# Patient Record
Sex: Male | Born: 1965 | Hispanic: No | Marital: Married | Smoking: Never smoker
Health system: Southern US, Community
[De-identification: ages and names within clinical notes are randomized; demographics above are authoritative.]

## PROBLEM LIST (undated history)

## (undated) DIAGNOSIS — E038 Other specified hypothyroidism: Principal | ICD-10-CM

## (undated) DIAGNOSIS — L309 Dermatitis, unspecified: Secondary | ICD-10-CM

## (undated) DIAGNOSIS — E782 Mixed hyperlipidemia: Secondary | ICD-10-CM

## (undated) DIAGNOSIS — S46912A Strain of unspecified muscle, fascia and tendon at shoulder and upper arm level, left arm, initial encounter: Secondary | ICD-10-CM

## (undated) DIAGNOSIS — B019 Varicella without complication: Secondary | ICD-10-CM

## (undated) HISTORY — DX: Varicella without complication: B01.9

## (undated) HISTORY — DX: Dermatitis, unspecified: L30.9

## (undated) HISTORY — DX: Mixed hyperlipidemia: E78.2

## (undated) HISTORY — DX: Strain of unspecified muscle, fascia and tendon at shoulder and upper arm level, left arm, initial encounter: S46.912A

## (undated) HISTORY — DX: Other specified hypothyroidism: E03.8

---

## 1993-11-20 HISTORY — PX: APPENDECTOMY: SHX54

## 2008-08-07 ENCOUNTER — Emergency Department (HOSPITAL_COMMUNITY): Admission: EM | Admit: 2008-08-07 | Discharge: 2008-08-07 | Payer: Self-pay | Admitting: Emergency Medicine
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
## 2010-05-18 ENCOUNTER — Ambulatory Visit: Payer: Self-pay | Admitting: Family Medicine

## 2010-05-18 DIAGNOSIS — Z9189 Other specified personal risk factors, not elsewhere classified: Secondary | ICD-10-CM | POA: Insufficient documentation

## 2010-05-18 DIAGNOSIS — E663 Overweight: Secondary | ICD-10-CM | POA: Insufficient documentation

## 2010-05-18 DIAGNOSIS — L2089 Other atopic dermatitis: Secondary | ICD-10-CM

## 2010-05-18 DIAGNOSIS — R109 Unspecified abdominal pain: Secondary | ICD-10-CM | POA: Insufficient documentation

## 2010-05-20 ENCOUNTER — Ambulatory Visit: Payer: Self-pay | Admitting: Family Medicine

## 2010-05-20 LAB — CONVERTED CEMR LAB
Bilirubin Urine: NEGATIVE
Glucose, Urine, Semiquant: NEGATIVE
pH: 7

## 2010-05-24 LAB — CONVERTED CEMR LAB
ALT: 31 units/L (ref 0–53)
AST: 31 units/L (ref 0–37)
Albumin: 4.6 g/dL (ref 3.5–5.2)
Alkaline Phosphatase: 73 units/L (ref 39–117)
BUN: 12 mg/dL (ref 6–23)
Basophils Absolute: 0 10*3/uL (ref 0.0–0.1)
Basophils Relative: 0.4 % (ref 0.0–3.0)
Bilirubin, Direct: 0.1 mg/dL (ref 0.0–0.3)
CO2: 30 meq/L (ref 19–32)
Calcium: 9.6 mg/dL (ref 8.4–10.5)
Chloride: 106 meq/L (ref 96–112)
Cholesterol: 201 mg/dL — ABNORMAL HIGH (ref 0–200)
Creatinine, Ser: 0.8 mg/dL (ref 0.4–1.5)
Direct LDL: 128.3 mg/dL
Eosinophils Absolute: 0.1 10*3/uL (ref 0.0–0.7)
Eosinophils Relative: 0.8 % (ref 0.0–5.0)
GFR calc non Af Amer: 110.16 mL/min (ref >60)
Glucose, Bld: 96 mg/dL (ref 70–99)
HCT: 45.2 % (ref 39.0–52.0)
HDL: 37.5 mg/dL — ABNORMAL LOW (ref >39.00)
Hemoglobin: 15.8 g/dL (ref 13.0–17.0)
Lymphocytes Relative: 41.4 % (ref 12.0–46.0)
Lymphs Abs: 3.1 10*3/uL (ref 0.7–4.0)
MCHC: 34.9 g/dL (ref 30.0–36.0)
MCV: 96.6 fL (ref 78.0–100.0)
Monocytes Absolute: 0.6 10*3/uL (ref 0.1–1.0)
Monocytes Relative: 7.7 % (ref 3.0–12.0)
Neutro Abs: 3.7 10*3/uL (ref 1.4–7.7)
Neutrophils Relative %: 49.7 % (ref 43.0–77.0)
Platelets: 261 10*3/uL (ref 150.0–400.0)
Potassium: 5.4 meq/L — ABNORMAL HIGH (ref 3.5–5.1)
RBC: 4.68 M/uL (ref 4.22–5.81)
RDW: 12.7 % (ref 11.5–14.6)
Sodium: 146 meq/L — ABNORMAL HIGH (ref 135–145)
TSH: 8.24 microintl units/mL — ABNORMAL HIGH (ref 0.35–5.50)
Total Bilirubin: 0.6 mg/dL (ref 0.3–1.2)
Total CHOL/HDL Ratio: 5
Total Protein: 7.8 g/dL (ref 6.0–8.3)
Triglycerides: 169 mg/dL — ABNORMAL HIGH (ref 0.0–149.0)
VLDL: 33.8 mg/dL (ref 0.0–40.0)
WBC: 7.4 10*3/uL (ref 4.5–10.5)

## 2010-06-15 ENCOUNTER — Ambulatory Visit: Payer: Self-pay | Admitting: Family Medicine

## 2010-06-15 DIAGNOSIS — R1012 Left upper quadrant pain: Secondary | ICD-10-CM

## 2010-06-15 DIAGNOSIS — E782 Mixed hyperlipidemia: Secondary | ICD-10-CM

## 2010-06-15 DIAGNOSIS — E039 Hypothyroidism, unspecified: Secondary | ICD-10-CM | POA: Insufficient documentation

## 2010-06-15 HISTORY — DX: Mixed hyperlipidemia: E78.2

## 2010-08-26 ENCOUNTER — Ambulatory Visit: Payer: Self-pay | Admitting: Family Medicine

## 2010-08-26 LAB — CONVERTED CEMR LAB: TSH: 4.62 microintl units/mL (ref 0.35–5.50)

## 2010-12-09 ENCOUNTER — Telehealth (INDEPENDENT_AMBULATORY_CARE_PROVIDER_SITE_OTHER): Payer: Self-pay | Admitting: *Deleted

## 2010-12-09 ENCOUNTER — Ambulatory Visit
Admission: RE | Admit: 2010-12-09 | Discharge: 2010-12-09 | Payer: Self-pay | Source: Home / Self Care | Attending: Family Medicine | Admitting: Family Medicine

## 2010-12-09 ENCOUNTER — Ambulatory Visit: Admission: RE | Admit: 2010-12-09 | Payer: Self-pay | Source: Home / Self Care | Admitting: Family Medicine

## 2010-12-09 ENCOUNTER — Other Ambulatory Visit: Payer: Self-pay | Admitting: Family Medicine

## 2010-12-09 LAB — HEPATIC FUNCTION PANEL
ALT: 66 U/L — ABNORMAL HIGH (ref 0–53)
AST: 32 U/L (ref 0–37)
Albumin: 4.5 g/dL (ref 3.5–5.2)
Alkaline Phosphatase: 67 U/L (ref 39–117)
Bilirubin, Direct: 0 mg/dL (ref 0.0–0.3)
Total Bilirubin: 0.4 mg/dL (ref 0.3–1.2)
Total Protein: 7.8 g/dL (ref 6.0–8.3)

## 2010-12-09 LAB — CBC WITH DIFFERENTIAL/PLATELET
Basophils Absolute: 0 10*3/uL (ref 0.0–0.1)
Basophils Relative: 0.4 % (ref 0.0–3.0)
Eosinophils Absolute: 0.1 10*3/uL (ref 0.0–0.7)
Eosinophils Relative: 1.1 % (ref 0.0–5.0)
HCT: 47.7 % (ref 39.0–52.0)
Hemoglobin: 16.8 g/dL (ref 13.0–17.0)
Lymphocytes Relative: 40.2 % (ref 12.0–46.0)
Lymphs Abs: 3.3 10*3/uL (ref 0.7–4.0)
MCHC: 35.1 g/dL (ref 30.0–36.0)
MCV: 95.3 fl (ref 78.0–100.0)
Monocytes Absolute: 0.5 10*3/uL (ref 0.1–1.0)
Monocytes Relative: 6.6 % (ref 3.0–12.0)
Neutro Abs: 4.2 10*3/uL (ref 1.4–7.7)
Neutrophils Relative %: 51.7 % (ref 43.0–77.0)
Platelets: 265 10*3/uL (ref 150.0–400.0)
RBC: 5.01 Mil/uL (ref 4.22–5.81)
RDW: 12.5 % (ref 11.5–14.6)
WBC: 8.2 10*3/uL (ref 4.5–10.5)

## 2010-12-09 LAB — BASIC METABOLIC PANEL
BUN: 12 mg/dL (ref 6–23)
CO2: 31 mEq/L (ref 19–32)
Calcium: 9.6 mg/dL (ref 8.4–10.5)
Chloride: 104 mEq/L (ref 96–112)
Creatinine, Ser: 0.8 mg/dL (ref 0.4–1.5)
GFR: 106.83 mL/min (ref >60.00)
Glucose, Bld: 91 mg/dL (ref 70–99)
Potassium: 4.8 mEq/L (ref 3.5–5.1)
Sodium: 142 mEq/L (ref 135–145)

## 2010-12-09 LAB — TSH: TSH: 8.18 u[IU]/mL — ABNORMAL HIGH (ref 0.35–5.50)

## 2010-12-09 LAB — LIPID PANEL
Cholesterol: 237 mg/dL — ABNORMAL HIGH (ref 0–200)
HDL: 35.7 mg/dL — ABNORMAL LOW (ref >39.00)
Total CHOL/HDL Ratio: 7
Triglycerides: 254 mg/dL — ABNORMAL HIGH (ref 0.0–149.0)
VLDL: 50.8 mg/dL — ABNORMAL HIGH (ref 0.0–40.0)

## 2010-12-09 LAB — LDL CHOLESTEROL, DIRECT: Direct LDL: 158.8 mg/dL

## 2010-12-16 ENCOUNTER — Ambulatory Visit
Admission: RE | Admit: 2010-12-16 | Discharge: 2010-12-16 | Payer: Self-pay | Source: Home / Self Care | Attending: Family Medicine | Admitting: Family Medicine

## 2010-12-16 DIAGNOSIS — R748 Abnormal levels of other serum enzymes: Secondary | ICD-10-CM | POA: Insufficient documentation

## 2010-12-20 NOTE — Assessment & Plan Note (Signed)
Summary: 1 month fup//ccm   Vital Signs:  Patient profile:   45 year old male Height:      66.75 inches (169.55 cm) Weight:      172 pounds (78.18 kg) O2 Sat:      96 % on Room air Temp:     98.9 degrees F (37.17 degrees C) oral Pulse rate:   96 / minute BP sitting:   118 / 72  (left arm) Cuff size:   regular  Vitals Entered By: Josph Macho RMA (June 15, 2010 10:24 AM)  O2 Flow:  Room air CC: 1 month follow up/ CF Is Patient Diabetic? No   History of Present Illness: Patient in today for f/u on new patient appt. He started on probiotics and Benefiber and his abdominal discomfort is greatly improved. He now gets only an occasional mild pain in the LUQ. He is eating well and moving his bowels comfortably on a daily basis. No bloody or tarry stool. No n/v/diarrhea/f/c/CP/palp.  He does still struggle with some fatigue and some pruritus on his trunk. His wife is with him and confirms he frequently c/o pruritus on his chest and midback but she never sees any rash. No recent illness/GU c/o or SOB  Current Medications (verified): 1)  Benefiber .... Once Daily  Allergies (verified): No Known Drug Allergies  Past History:  Past medical history reviewed for relevance to current acute and chronic problems. Social history (including risk factors) reviewed for relevance to current acute and chronic problems.  Social History: Reviewed history from 05/18/2010 and no changes required. Occupation: Unemployed at present, does plant engineering/maintenance Married Alcohol use-no Drug use-no Regular exercise-no  Review of Systems      See HPI  Physical Exam  General:  Well-developed,well-nourished,in no acute distress; alert,appropriate and cooperative throughout examination Head:  Normocephalic and atraumatic without obvious abnormalities. No apparent alopecia or balding. Mouth:  Oral mucosa and oropharynx without lesions or exudates.  Teeth in good repair. Neck:  No deformities,  masses, or tenderness noted. Heart:  Normal rate and regular rhythm. S1 and S2 normal without gallop, murmur, click, rub or other extra sounds. Abdomen:  Bowel sounds positive,abdomen soft and non-tender without masses, organomegaly or hernias noted. Msk:  No deformity or scoliosis noted of thoracic or lumbar spine.   Extremities:  No clubbing, cyanosis, edema, or deformity noted with normal full range of motion of all joints.   Skin:  Intact without suspicious lesions or rashes Cervical Nodes:  No lymphadenopathy noted Psych:  Cognition and judgment appear intact. Alert and cooperative with normal attention span and concentration. No apparent delusions, illusions, hallucinations   Impression & Recommendations:  Problem # 1:  UNSPECIFIED HYPOTHYROIDISM (ICD-244.9)  His updated medication list for this problem includes:    Levothroid 25 Mcg Tabs (Levothyroxine sodium) .Marland Kitchen... 1 tab by mouth daily Recheck TSH in 10-12 wks  Problem # 2:  LUQ PAIN (ICD-789.02) Mild, infrequent, report if it worsens, report any observable pattern, ie better when he eats or does not eat. Use Tums or Rolaids prn  Problem # 3:  CYSTIC ACNE (ICD-706.1) Cetaphil soap and Witch Hazel Astringent, if skin flares try Betasept wash daily as needed  Problem # 4:  DERMATITIS, ATOPIC (ICD-691.8) Pruritus without any concerning rash, try Zyrtec or Claritin daily and may try Sarna anti-itch lotion if no resolvution  Problem # 5:  OVERWEIGHT (ICD-278.02) Avoid trans fats, eat small frequent meals with complex carbs and lean proteins, add regular exercise  Problem #  6:  MIXED HYPERLIPIDEMIA (ICD-272.2) Avoid trans fats, use fish oil daily, increase exercise  Complete Medication List: 1)  Benefiber  .... Once daily 2)  Levothroid 25 Mcg Tabs (Levothyroxine sodium) .Marland Kitchen.. 1 tab by mouth daily 3)  Betasept Surgical Scrub 4 % Liqd (Chlorhexidine gluconate) .... Wash daily during acne flares  Patient Instructions: 1)  Please  schedule a follow-up appointment in 6 months .  2)  It is important that you exercise reguarly at least 20 minutes 5 times a week. If you develop chest pain, have severe difficulty breathing, or feel very tired, stop exercising immediately and seek medical attention.  3)  BMP prior to visit, ICD-9: 278.02 4)  Hepatic Panel prior to visit ICD-9: 272.0 5)  Lipid panel prior to visit ICD-9 : 272.0 6)  TSH prior to visit ICD-9 : 244.9 7)  CBC w/ Diff prior to visit ICD-9 : 780.79 8)  Needs all labs in 6 months prior to appt 9)  Needs TSH in 10-12 weeks as well 10)  For pruritus try either Claritin (generic is Loratadine) 10mg  daily or Zyrtec (generic is Cetirizine) 10mg  daily and Sarna anti itch lotion. 11)  For abdominal pain try Tums or Rolaids and pay attention to when you last ate or had a BM 12)  Avoid Partially Hydrogenated oils (trans fats) 13)  For Acne use Cetaphil Soap, Witch Hazel Astringent and for flares Betasept Wash Prescriptions: BETASEPT SURGICAL SCRUB 4 % LIQD (CHLORHEXIDINE GLUCONATE) wash daily during Acne flares  #1 bottle x 3   Entered and Authorized by:   Danise Edge MD   Signed by:   Danise Edge MD on 06/15/2010   Method used:   Electronically to        [facility]  [phone]* (retail)       [address]       [place], [place]  [address]       Ph: [phone] or [phone]       Fax: [phone]   RxID:   [id number] LEVOTHROID 25 MCG TABS (LEVOTHYROXINE SODIUM) 1 tab by mouth daily  #30 x 5   Entered and Authorized by:   Danise Edge MD   Signed by:   Danise Edge MD on 06/15/2010   Method used:   Electronically to        [facility]  [phone]* (retail)       [address]       [place], [place]  [address]       Ph: [phone] or [phone]       Fax: [phone]   RxID:   [id number]

## 2010-12-20 NOTE — Assessment & Plan Note (Signed)
Summary: NEW PT EST // RS   Vital Signs:  Patient profile:   45 year old male Height:      66.75 inches (169.55 cm) Weight:      170 pounds (77.27 kg) BMI:     26.92 O2 Sat:      94 % on Room air Temp:     98.0 degrees F (36.67 degrees C) oral Pulse rate:   80 / minute BP sitting:   120 / 80  (left arm) Cuff size:   regular  Vitals Entered By: Kathrynn Speed CMA (May 18, 2010 9:12 AM)  O2 Flow:  Room air CC: New Pt to establish /  adb pain for one month/ itchy skin on chest & back/ src Is Patient Diabetic? No   History of Present Illness: Patient in for new patient appt. Generally in good health. He is originally from [place] so his English is somewhat broken, his wife is here to help him communicate. He has not had a check up in a while and is here today noting a month and a half of fleeting, vague abdominal pains. He reports it can happen a couple times a day or can not occur for several days. The pains last less than 10 seconds, are sharp and not associated with any other symptoms. No recent febrile illness/constipation/n/v/anorexia/bloody or tarry stool. He has a long history of occasional diarrhea with urgency dating back to when he had an appendectomy secondary to a ruptured appendix, but this has not increased in frequency or intensity. No GU c/o. No CP/palp/SOB/URI symptoms. Relays a history of acute jaundice at age 63 years that has not recurred and he does not remember well but he remembers being itchy, his mother relays that history.  He gets intermittent skin rashes on his chest and back which are mildly pruritic (not present now) and recurrent acne on trunk and face Also notes occasional b/l flank pain, fleeting and not present now.   Preventive Screening-Counseling & Management  Alcohol-Tobacco     Alcohol drinks/day: 0     Smoking Status: never  Caffeine-Diet-Exercise     Does Patient Exercise: no  Safety-Violence-Falls     Seat Belt Use: yes     Smoke  Detectors: yes      Sexual History:  currently monogamous.        Drug Use:  never and no.        Travel History:  [place], last trip about a year ago to [place], and his country of origin.    Current Medications (verified): 1)  None  Allergies (verified): No Known Drug Allergies  Comments:  Nurse/Medical Assistant: The patient's medications were reviewed with the patient and were updated in the Medication List. Kathrynn Speed CMA (May 18, 2010 9:14 AM)  Family History: Father: deceased @ 41, pneumonia Mother: deceased @67 , stroke, HTN Siblings:  Sister: 110, hrt dx, s/p MI @ 17, s/p stroke @ 10, HTN Sister: 43, A&W Sister: 15, unknown Sister: 19, s/p corneal transplant for cataract Sister: 66, A&W Sister: 2, DM Sister: 74, cholelithiasis s/p cholecystectomy Sister: 39, A&W Brother: 50 eye surgery Brother: deceased @ 28, meningitis, cerebral pasy likely from birth MGM:   unknown hx of grandparents MGF: PGM: PGF: Children: Son 80, A&W  Social History: Occupation: Unemployed at present, does plant engineering/maintenance Married Alcohol use-no Drug use-no Regular exercise-no Smoking Status:  never Occupation:  employed Drug Use:  never, no Does Patient Exercise:  no Seat Belt Use:  yes  Sexual History:  currently monogamous  Review of Systems       The patient complains of abdominal pain.  The patient denies anorexia, fever, weight loss, weight gain, vision loss, decreased hearing, hoarseness, chest pain, syncope, dyspnea on exertion, peripheral edema, prolonged cough, headaches, hemoptysis, melena, hematochezia, severe indigestion/heartburn, hematuria, incontinence, muscle weakness, suspicious skin lesions, difficulty walking, depression, unusual weight change, abnormal bleeding, enlarged lymph nodes, and testicular masses.    Physical Exam  General:  Well-developed,well-nourished,in no acute distress; alert,appropriate and cooperative throughout  examination Head:  Normocephalic and atraumatic without obvious abnormalities. Eyes:  No corneal or conjunctival inflammation noted. EOMI. Perrla. Funduscopic exam benign, without hemorrhages, exudates or papilledema. Vision grossly normal. Ears:  External ear exam shows no significant lesions or deformities.  Otoscopic examination reveals clear canals, tympanic membranes are intact bilaterally without bulging, retraction, inflammation or discharge. Hearing is grossly normal bilaterally. Nose:  External nasal examination shows no deformity or inflammation. Nasal mucosa are pink and moist without lesions or exudates. Mouth:  Oral mucosa and oropharynx without lesions or exudates.  Teeth in good repair. Neck:  No deformities, masses, or tenderness noted. Lungs:  Normal respiratory effort, chest expands symmetrically. Lungs are clear to auscultation, no crackles or wheezes. Heart:  Normal rate and regular rhythm. S1 and S2 normal without gallop, murmur, click, rub or other extra sounds. Abdomen:  Bowel sounds positive,abdomen soft and non-tender without masses, organomegaly or hernias noted.no guarding, no rigidity, and no rebound tenderness.   Msk:  No deformity or scoliosis noted of thoracic or lumbar spine.   Pulses:  R and L carotid,radial,femoral,dorsalis pedis and posterior tibial pulses are full and equal bilaterally Extremities:  No clubbing, cyanosis, edema, or deformity noted with normal full range of motion of all joints.   Neurologic:  No cranial nerve deficits noted. Station and gait are normal. Plantar reflexes are down-going bilaterally. DTRs are symmetrical throughout. Sensory, motor and coordinative functions appear intact. Skin:  Intact without suspicious lesions or rashes Cervical Nodes:  No lymphadenopathy noted Psych:  Cognition and judgment appear intact. Alert and cooperative with normal attention span and concentration. No apparent delusions, illusions,  hallucinations   Impression & Recommendations:  Problem # 1:  ABDOMINAL PAIN, UNSPECIFIED SITE (ICD-789.00) Vague, mild, he is to report worsening symptoms, add Align and Benefiber. Increase hydration and exercise, report if symptoms worsen  Problem # 2:  CYSTIC ACNE (ICD-706.1) Betasept wash every few days and consider antibiotics if lesions persist  Problem # 3:  JAUNDICE, HX OF (ICD-V15.9) Check LFTs  Problem # 4:  OVERWEIGHT (ICD-278.02) Avoid trans fats, increase exercise  Patient Instructions: 1)  Please schedule a follow-up appointment in 1 month.  2)  Please return for lab work one(1) week before your next appointment.  3)  Please return for a FASTING Lipid Profile one(1) week before your next appointment as scheduled.  4)  BMP prior to visit, ICD-9: 789.0 5)  Hepatic Panel prior to visit ICD-9: 789.0 6)  Lipid panel prior to visit ICD-9 : 272.4 7)  TSH prior to visit ICD-9 : 278.2 8)  CBC w/ Diff prior to visit ICD-9 : 789.0 9)  Try an Align capsule daily and Benefiber 2 tsp daily in 8 oz of water til seen again  Prevention & Chronic Care Immunizations   Influenza vaccine: Not documented    Tetanus booster: Not documented    Pneumococcal vaccine: Not documented  Other Screening   Smoking status: never  (05/18/2010)  Lipids  Total Cholesterol: Not documented   LDL: Not documented   LDL Direct: Not documented   HDL: Not documented   Triglycerides: Not documented

## 2010-12-22 NOTE — Progress Notes (Signed)
Summary: Change in medication       New/Updated Medications: LEVOTHROID 50 MCG TABS (LEVOTHYROXINE SODIUM) once daily

## 2010-12-22 NOTE — Assessment & Plan Note (Signed)
Summary: f/u appointment/vfw   Vital Signs:  Patient profile:   45 year old male Height:      66.75 inches (169.55 cm) Weight:      176.50 pounds (80.23 kg) O2 Sat:      96 % on Room air Temp:     98.5 degrees F (36.94 degrees C) oral Pulse rate:   72 / minute BP sitting:   133 / 90  (right arm) Cuff size:   regular  Vitals Entered By: Josph Macho RMA (December 16, 2010 1:07 PM)  O2 Flow:  Room air  Serial Vital Signs/Assessments:  Time      Position  BP       Pulse  Resp  Temp     By                     128/82                         Danise Edge MD  CC: Follow-up visit/ CF Is Patient Diabetic? No   History of Present Illness: she is a 45 year old Hispanic male in today for followup. She reports generally feeling well and having significantly less abdominal pain and discomfort than he did when he first started with our office. He reports maybe once a week he'll have some mild abdominal pain which passes without iIncidents. He denies any nausea, vomiting, anorexia, constipation, diarrhea, fevers, chills, back pain. He denies any recent illness, chest pain, shortness of breath, fevers, chills, GU complaints at this time. He doesn't balance eating a significant amount of carbohydrates especially in the form of rice. He has been trying to avoid trans fats  Current Medications (verified): 1)  Benefiber .... Once Daily 2)  Levothroid 50 Mcg Tabs (Levothyroxine Sodium) .... Once Daily 3)  Betasept Surgical Scrub 4 % Liqd (Chlorhexidine Gluconate) .... Wash Daily During Acne Flares  Allergies (verified): No Known Drug Allergies  Past History:  Past medical history reviewed for relevance to current acute and chronic problems. Social history (including risk factors) reviewed for relevance to current acute and chronic problems.  Social History: Reviewed history from 05/18/2010 and no changes required. Occupation: Unemployed at present, does plant  engineering/maintenance Married Alcohol use-no Drug use-no Regular exercise-no  Review of Systems      See HPI  Physical Exam  General:  Well-developed,well-nourished,in no acute distress; alert,appropriate and cooperative throughout examination Head:  Normocephalic and atraumatic without obvious abnormalities. No apparent alopecia or balding. Mouth:  Oral mucosa and oropharynx without lesions or exudates.  Teeth in good repair. Neck:  No deformities, masses, or tenderness noted. Lungs:  Normal respiratory effort, chest expands symmetrically. Lungs are clear to auscultation, no crackles or wheezes. Heart:  Normal rate and regular rhythm. S1 and S2 normal without gallop, murmur, click, rub or other extra sounds. Abdomen:  Bowel sounds positive,abdomen soft and non-tender without masses, organomegaly or hernias noted. Extremities:  No clubbing, cyanosis, edema, or deformity noted with normal full range of motion of all joints.   Cervical Nodes:  No lymphadenopathy noted Psych:  Cognition and judgment appear intact. Alert and cooperative with normal attention span and concentration. No apparent delusions, illusions, hallucinations   Impression & Recommendations:  Problem # 1:  MIXED HYPERLIPIDEMIA (ICD-272.2) Increasing numbers, patient again encouraged to avoid trans fats and increase exercise, takes his omega fatty acids infrequently encouraged to take them more routinely.   Problem # 2:  OTHER NONSPECIFIC ABNORMAL  SERUM ENZYME LEVELS (ICD-790.5) New finding, given his history we will follow every six months, he is asked to decrease simple carb intake and avoid fatty foods, increase exercise and add an exercise regimen  Problem # 3:  UNSPECIFIED HYPOTHYROIDISM (ICD-244.9)  His updated medication list for this problem includes:    Levothroid 50 Mcg Tabs (Levothyroxine sodium) .Marland Kitchen... 1 tab by mouth daily TSH has been mildly elevated, will repeat in 8 weeks if still elevated will need  to increase Levothyroid  Problem # 4:  ABDOMINAL PAIN, UNSPECIFIED SITE (ICD-789.00) Never any clear etiology but with dietary changes symptoms are significantly improved. If symptoms worsen again he will notify the office for futher evaluation  Complete Medication List: 1)  Benefiber  .... Once daily 2)  Levothroid 50 Mcg Tabs (Levothyroxine sodium) .Marland Kitchen.. 1 tab by mouth daily 3)  Betasept Surgical Scrub 4 % Liqd (Chlorhexidine gluconate) .... Wash daily during acne flares  Patient Instructions: 1)  TSH prior to visit ICD-9 : in 8-10 weeks 2)  Please schedule a follow-up appointment in 6-7 months, labs prior 3)  BMP prior to visit, ICD-9:  4)  Hepatic Panel prior to visit ICD-9:  5)  Lipid panel prior to visit ICD-9 :  6)  TSH prior to visit ICD-9 :  7)  CBC w/ Diff prior to visit ICD-9 :  8)  2 fish oil caps daily 9)  Benefiber daily and may add to rice 10)  Move more Prescriptions: LEVOTHROID 50 MCG TABS (LEVOTHYROXINE SODIUM) 1 tab by mouth daily  #30 x 3   Entered and Authorized by:   Danise Edge MD   Signed by:   Danise Edge MD on 12/16/2010   Method used:   Electronically to        [facility]  [phone]* (retail)       [address]       [place], [place]  [address]       Ph: [phone] or [phone]       Fax: [phone]   RxID:   [id number]    Orders Added: 1)  Est. Patient Level IV [56387]

## 2010-12-29 ENCOUNTER — Encounter: Payer: Self-pay | Admitting: Family Medicine

## 2011-02-23 ENCOUNTER — Other Ambulatory Visit (INDEPENDENT_AMBULATORY_CARE_PROVIDER_SITE_OTHER): Payer: Self-pay

## 2011-02-23 ENCOUNTER — Other Ambulatory Visit: Payer: Self-pay

## 2011-02-23 DIAGNOSIS — E663 Overweight: Secondary | ICD-10-CM

## 2011-02-23 MED ORDER — LEVOTHYROXINE SODIUM 75 MCG PO TABS: 75.0000 ug | ORAL_TABLET | Freq: Every day | ORAL | Status: DC

## 2011-02-24 ENCOUNTER — Other Ambulatory Visit: Payer: Self-pay

## 2012-06-14 ENCOUNTER — Ambulatory Visit: Payer: Self-pay | Admitting: Family Medicine

## 2012-07-23 ENCOUNTER — Encounter: Payer: Self-pay | Admitting: Family Medicine

## 2012-07-23 ENCOUNTER — Ambulatory Visit (INDEPENDENT_AMBULATORY_CARE_PROVIDER_SITE_OTHER): Payer: BC Managed Care – PPO | Admitting: Family Medicine

## 2012-07-23 VITALS — BP 138/82 | HR 77 | Temp 98.0°F | Ht 66.75 in | Wt 176.4 lb

## 2012-07-23 DIAGNOSIS — IMO0002 Reserved for concepts with insufficient information to code with codable children: Secondary | ICD-10-CM

## 2012-07-23 DIAGNOSIS — E782 Mixed hyperlipidemia: Secondary | ICD-10-CM

## 2012-07-23 DIAGNOSIS — S46912A Strain of unspecified muscle, fascia and tendon at shoulder and upper arm level, left arm, initial encounter: Secondary | ICD-10-CM | POA: Insufficient documentation

## 2012-07-23 DIAGNOSIS — E039 Hypothyroidism, unspecified: Secondary | ICD-10-CM

## 2012-07-23 DIAGNOSIS — Z0000 Encounter for general adult medical examination without abnormal findings: Secondary | ICD-10-CM

## 2012-07-23 HISTORY — DX: Strain of unspecified muscle, fascia and tendon at shoulder and upper arm level, left arm, initial encounter: S46.912A

## 2012-07-23 LAB — CBC
Hemoglobin: 14.9 g/dL (ref 13.0–17.0)
MCHC: 33.5 g/dL (ref 30.0–36.0)
MCV: 96.4 fl (ref 78.0–100.0)
Platelets: 232 10*3/uL (ref 150.0–400.0)
RBC: 4.61 Mil/uL (ref 4.22–5.81)

## 2012-07-23 LAB — RENAL FUNCTION PANEL
Albumin: 4.1 g/dL (ref 3.5–5.2)
BUN: 13 mg/dL (ref 6–23)
CO2: 28 mEq/L (ref 19–32)
Chloride: 103 mEq/L (ref 96–112)
GFR: 103.18 mL/min (ref >60.00)
Potassium: 4.4 mEq/L (ref 3.5–5.1)

## 2012-07-23 LAB — HEPATIC FUNCTION PANEL
ALT: 39 U/L (ref 0–53)
Albumin: 4.1 g/dL (ref 3.5–5.2)
Alkaline Phosphatase: 60 U/L (ref 39–117)
Bilirubin, Direct: 0.1 mg/dL (ref 0.0–0.3)
Total Protein: 7.1 g/dL (ref 6.0–8.3)

## 2012-07-23 LAB — LIPID PANEL: Cholesterol: 197 mg/dL (ref 0–200)

## 2012-07-23 LAB — TSH: TSH: 5.21 u[IU]/mL (ref 0.35–5.50)

## 2012-07-23 MED ORDER — MELOXICAM 15 MG PO TABS: 15.0000 mg | ORAL_TABLET | Freq: Every day | ORAL | Status: AC | PRN

## 2012-07-23 MED ORDER — CYCLOBENZAPRINE HCL 10 MG PO TABS: 10.0000 mg | ORAL_TABLET | Freq: Every evening | ORAL | Status: AC | PRN

## 2012-07-23 NOTE — Progress Notes (Signed)
Patient ID: Nathan Chen, male   DOB: 1966-10-03, 46 y.o.   MRN: [id number] Nathan Chen [id number] 07-08-1966 07/24/2012      Progress Note-Follow Up  Subjective  Chief Complaint  Chief Complaint  Patient presents with  . Shoulder Pain    X 2 months- left- unknown injury    HPI  Patient is a 46 yo hispanic male in today for evaluation of 2 month history of left shoulder pain. He denies any trauma or similar pain in his past. He notes the pain worsens and improves intermittently but bothers her every day. No radicular symptoms. Hurts most at the top of the shoulder but also hurts anteriorly and posteriorly around the shoulder at times. Notes some occasional spasms over the trapezius and anterior chest wall just below the shoulder as well. No other recent concerns, illness, fevers, cp, palp, sob, gi or gu c/o  Past Medical History  Diagnosis Date  . Chicken pox as a child  . Left shoulder strain 07/23/2012    Past Surgical History  Procedure Date  . Appendectomy 1995    Family History  Problem Relation Age of Onset  . Stroke Mother   . Hypertension Mother   . Pneumonia Father   . Heart disease Sister   . Heart attack Sister 70  . Stroke Sister 72  . Hypertension Sister   . Other Brother     eye surgery  . Other Brother     meningitis  . Cerebral palsy Brother     likely from birth  . Other Sister     s/p corneal transplant for cataract  . Diabetes Sister   . Cholelithiasis Sister     s/p cholecystectomy    History   Social History  . Marital Status: Married    Spouse Name: N/A    Number of Children: N/A  . Years of Education: N/A   Occupational History  . Not on file.   Social History Main Topics  . Smoking status: Never Smoker   . Smokeless tobacco: Never Used  . Alcohol Use: No  . Drug Use: No  . Sexually Active: Not on file   Other Topics Concern  . Not on file   Social History Narrative  . No narrative on file    Current Outpatient  Prescriptions on File Prior to Visit  Medication Sig Dispense Refill  . chlorhexidine (HIBICLENS) 4 % external liquid Apply topically daily as needed. Wash daily during Acne flares       . levothyroxine (SYNTHROID, LEVOTHROID) 75 MCG tablet Take 1 tablet (75 mcg total) by mouth daily.  30 tablet  1  . Wheat Dextrin (BENEFIBER PO) Take by mouth daily.          No Known Allergies  Review of Systems  Review of Systems  Constitutional: Negative for fever and malaise/fatigue.  HENT: Negative for congestion and neck pain.   Eyes: Negative for discharge.  Respiratory: Negative for shortness of breath.   Cardiovascular: Negative for chest pain, palpitations and leg swelling.  Gastrointestinal: Negative for nausea, abdominal pain and diarrhea.  Genitourinary: Negative for dysuria.  Musculoskeletal: Positive for joint pain. Negative for myalgias and falls.       Left shoulder pain x 2 months, off and on  Skin: Negative for rash.  Neurological: Negative for loss of consciousness and headaches.  Endo/Heme/Allergies: Negative for polydipsia.  Psychiatric/Behavioral: Negative for depression and suicidal ideas. The patient is not nervous/anxious and does not have insomnia.  Objective  BP 138/82  Pulse 77  Temp 98 F (36.7 C) (Temporal)  Ht 5' 6.75" (1.695 m)  Wt 176 lb 6.4 oz (80.015 kg)  BMI 27.84 kg/m2  SpO2 97%  Physical Exam  Physical Exam  Constitutional: He is oriented to person, place, and time and well-developed, well-nourished, and in no distress. No distress.  HENT:  Head: Normocephalic and atraumatic.  Eyes: Conjunctivae are normal.  Neck: Neck supple. No thyromegaly present.  Cardiovascular: Normal rate, regular rhythm and normal heart sounds.   No murmur heard. Pulmonary/Chest: Effort normal and breath sounds normal. No respiratory distress.  Abdominal: He exhibits no distension and no mass. There is no tenderness.  Musculoskeletal: Normal range of motion. He  exhibits edema. He exhibits no tenderness.       Tender with palp at caudal aspect of shoulder  Neurological: He is alert and oriented to person, place, and time.  Skin: Skin is warm.  Psychiatric: Memory, affect and judgment normal.    Lab Results  Component Value Date   TSH 5.21 07/23/2012   Lab Results  Component Value Date   WBC 6.1 07/23/2012   HGB 14.9 07/23/2012   HCT 44.4 07/23/2012   MCV 96.4 07/23/2012   PLT 232.0 07/23/2012   Lab Results  Component Value Date   CREATININE 0.9 07/23/2012   BUN 13 07/23/2012   NA 138 07/23/2012   K 4.4 07/23/2012   CL 103 07/23/2012   CO2 28 07/23/2012   Lab Results  Component Value Date   ALT 39 07/23/2012   AST 25 07/23/2012   ALKPHOS 60 07/23/2012   BILITOT 0.5 07/23/2012   Lab Results  Component Value Date   CHOL 197 07/23/2012   Lab Results  Component Value Date   HDL 35.20* 07/23/2012   No results found for this basename: LDLCALC   Lab Results  Component Value Date   TRIG 267.0* 07/23/2012   Lab Results  Component Value Date   CHOLHDL 6 07/23/2012     Assessment & Plan  MIXED HYPERLIPIDEMIA He has had some coffee with cream and a croissant today but he has moved further away so it is hard for him to get back easily we will proceed with annual fasting labs today to reevaluate his cholesterol and then see him in follow up in roughly 3 months for annual exam  Left shoulder strain Intermittent pain most notable just at top of shoulder but with muscle spasms notedintermittently into trapezius and up his SCM to his neck. Encouraged moist heat and gentle stretching, he is given Cyclobenzaprine for qhs use and Meloxicam for am use, notify us if no irmprovement so we can refer to ortho for further consideration.

## 2012-07-23 NOTE — Assessment & Plan Note (Signed)
Intermittent pain most notable just at top of shoulder but with muscle spasms notedintermittently into trapezius and up his SCM to his neck. Encouraged moist heat and gentle stretching, he is given Cyclobenzaprine for qhs use and Meloxicam for am use, notify us if no irmprovement so we can refer to ortho for further consideration.

## 2012-07-23 NOTE — Assessment & Plan Note (Signed)
He has had some coffee with cream and a croissant today but he has moved further away so it is hard for him to get back easily we will proceed with annual fasting labs today to reevaluate his cholesterol and then see him in follow up in roughly 3 months for annual exam

## 2012-07-23 NOTE — Patient Instructions (Addendum)
Bursitis  Bursitis is a swelling and soreness (inflammation) of a fluid-filled sac (bursa) that overlies and protects a joint. It can be caused by injury, overuse of the joint, arthritis or infection. The joints most likely to be affected are the elbows, shoulders, hips and knees.  HOME CARE INSTRUCTIONS    Apply ice to the affected area for 15 to 20 minutes each hour while awake for 2 days. Put the ice in a plastic bag and place a towel between the bag of ice and your skin.   Rest the injured joint as much as possible, but continue to put the joint through a full range of motion, 4 times per day. (The shoulder joint especially becomes rapidly "frozen" if not used.) When the pain lessens, begin normal slow movements and usual activities.   Only take over-the-counter or prescription medicines for pain, discomfort or fever as directed by your caregiver.   Your caregiver may recommend draining the bursa and injecting medicine into the bursa. This may help the healing process.   Follow all instructions for follow-up with your caregiver. This includes any orthopedic referrals, physical therapy and rehabilitation. Any delay in obtaining necessary care could result in a delay or failure of the bursitis to heal and chronic pain.  SEEK IMMEDIATE MEDICAL CARE IF:    Your pain increases even during treatment.   You develop an oral temperature above 102 F (38.9 C) and have heat and inflammation over the involved bursa.  MAKE SURE YOU:    Understand these instructions.   Will watch your condition.   Will get help right away if you are not doing well or get worse.  Document Released: 11/03/2000 Document Revised: 10/26/2011 Document Reviewed: 10/08/2009  [employer] Patient Information 2012 [employer].

## 2012-07-24 ENCOUNTER — Telehealth: Payer: Self-pay

## 2012-07-24 NOTE — Telephone Encounter (Signed)
Per MD inform pt that labs were normal.   Left a message for patient to return my call

## 2012-07-24 NOTE — Telephone Encounter (Signed)
Patient informed. 

## 2015-01-22 ENCOUNTER — Telehealth: Payer: Self-pay | Admitting: Family Medicine

## 2015-01-22 NOTE — Telephone Encounter (Signed)
Patient would like transfer to Dr. Selena BattenKim.  Please advise if it's okay.

## 2015-01-24 NOTE — Telephone Encounter (Signed)
Fine with me if OK with Dr Selena BattenKim

## 2015-01-24 NOTE — Telephone Encounter (Signed)
Ok with me - please help him schedule new pt visit. Thanks.

## 2015-01-25 NOTE — Telephone Encounter (Signed)
done

## 2015-01-28 ENCOUNTER — Encounter: Payer: Self-pay | Admitting: Family Medicine

## 2015-01-28 ENCOUNTER — Ambulatory Visit (INDEPENDENT_AMBULATORY_CARE_PROVIDER_SITE_OTHER): Payer: BLUE CROSS/BLUE SHIELD | Admitting: Family Medicine

## 2015-01-28 VITALS — BP 120/82 | HR 74 | Temp 98.6°F | Ht 67.25 in | Wt 176.5 lb

## 2015-01-28 DIAGNOSIS — Z131 Encounter for screening for diabetes mellitus: Secondary | ICD-10-CM

## 2015-01-28 DIAGNOSIS — L309 Dermatitis, unspecified: Secondary | ICD-10-CM | POA: Insufficient documentation

## 2015-01-28 DIAGNOSIS — Z114 Encounter for screening for human immunodeficiency virus [HIV]: Secondary | ICD-10-CM

## 2015-01-28 DIAGNOSIS — E038 Other specified hypothyroidism: Secondary | ICD-10-CM

## 2015-01-28 DIAGNOSIS — E782 Mixed hyperlipidemia: Secondary | ICD-10-CM

## 2015-01-28 DIAGNOSIS — Z7689 Persons encountering health services in other specified circumstances: Secondary | ICD-10-CM

## 2015-01-28 DIAGNOSIS — Z7189 Other specified counseling: Secondary | ICD-10-CM

## 2015-01-28 HISTORY — DX: Other specified hypothyroidism: E03.8

## 2015-01-28 HISTORY — DX: Dermatitis, unspecified: L30.9

## 2015-01-28 MED ORDER — TRIAMCINOLONE ACETONIDE 0.1 % EX CREA: 1.0000 "application " | TOPICAL_CREAM | Freq: Two times a day (BID) | CUTANEOUS | Status: DC

## 2015-01-28 NOTE — Patient Instructions (Addendum)
BEFORE YOU LEAVE: -schedule lab appointment for fasting labs -follow up in 3-4 months  For the arm: -steroid cream and antifungal 1-2 times daily for 3 weeks -see dermatologist if persist  We recommend the following healthy lifestyle measures: - eat a healthy diet consisting of lots of vegetables, fruits, beans, nuts, seeds, healthy meats such as white chicken and fish and whole grains.  - avoid fried foods, fast food, processed foods, sodas, red meet and other fattening foods.  - get a least 150 minutes of aerobic exercise per week.

## 2015-01-28 NOTE — Addendum Note (Signed)
Addended by: Terressa KoyanagiKIM, HANNAH R on: 01/28/2015 12:12 PM   Modules accepted: Orders

## 2015-01-28 NOTE — Progress Notes (Signed)
Pre visit review using our clinic review tool, if applicable. No additional management support is needed unless otherwise documented below in the visit note. 

## 2015-01-28 NOTE — Progress Notes (Signed)
HPI:  Nathan Chen is here to establish care. [place] [facility]. Last PCP and physical: has been about 1-2 years since physical per his report  Has the following chronic problems that require follow up and concerns today:  Skin issues: -a few skin tags on face wants cosmetic removal and very concerned about appearance -patch of scally dry skin on L forearm for 1 year -thinks is fungal, tried treating with moisterizer -denies: malaise, fevers -hx atopic dermatitis in chart  Hypothyroidism: -mild -treated in the past per his report, but then told to stop synthroid per his report and has not taken synthroid in 2-3 years -denies: constipations, lethargy, fatigues, hot/cold intol  ROS negative for unless reported above: fevers, unintentional weight loss, hearing or vision loss, chest pain, palpitations, struggling to breath, hemoptysis, melena, hematochezia, hematuria, falls, loc, si, thoughts of self harm  Past Medical History  Diagnosis Date  . Chicken pox as a child  . Left shoulder strain 07/23/2012  . Eczema 01/28/2015  . Other specified hypothyroidism 01/28/2015  . Mixed hyperlipidemia 06/15/2010    Qualifier: Diagnosis of  By: Abner GreenspanBlyth MD, Misty StanleyStacey      Past Surgical History  Procedure Laterality Date  . Appendectomy  1995    Family History  Problem Relation Age of Onset  . Stroke Mother   . Hypertension Mother   . Pneumonia Father   . Heart disease Sister   . Heart attack Sister [phone]  . Stroke Sister [phone]  . Hypertension Sister   . Other Brother     eye surgery  . Other Brother     meningitis  . Cerebral palsy Brother     likely from birth  . Other Sister     s/p corneal transplant for cataract  . Diabetes Sister   . Cholelithiasis Sister     s/p cholecystectomy    History   Social History  . Marital Status: Married    Spouse Name: N/A  . Number of Children: N/A  . Years of Education: N/A   Social History Main Topics  . Smoking status: Never Smoker    . Smokeless tobacco: Never Used  . Alcohol Use: No  . Drug Use: No  . Sexual Activity: Not on file   Other Topics Concern  . 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          No current outpatient prescriptions on file.  EXAM:  Filed Vitals:   01/28/15 1112  BP: 120/82  Pulse: 74  Temp: 98.6 F (37 C)    Body mass index is 27.44 kg/(m^2).  GENERAL: vitals reviewed and listed above, alert, oriented, appears well hydrated and in no acute distress  HEENT: atraumatic, conjunttiva clear, no obvious abnormalities on inspection of external nose and ears  NECK: no obvious masses on inspection  LUNGS: clear to auscultation bilaterally, no wheezes, rales or rhonchi, good air movement  CV: HRRR, no peripheral edema  SKIN: circular patch of dry skin L forearm  MS: moves all extremities without noticeable abnormality  PSYCH: pleasant and cooperative, no obvious depression or anxiety  ASSESSMENT AND PLAN:  Discussed the following assessment and plan:  Other specified hypothyroidism - Plan: TSH  Eczema  Mixed hyperlipidemia - Plan: Lipid Panel  Encounter to establish care  Diabetes mellitus screening - Plan: Hemoglobin A1c  Encounter for screening for HIV - Plan: HIV antibody (with reflex)   -We reviewed the PMH, PSH, FH, SH, Meds and Allergies. -We provided refills for any medications we will prescribe as needed. -We addressed current concerns per orders and patient instructions. -We have asked for records for pertinent exams, studies, vaccines and notes from previous providers. -We have advised patient to follow up per instructions below.   -Patient advised to return or notify a doctor immediately if symptoms worsen or  persist or new concerns arise.  Patient Instructions  BEFORE YOU LEAVE: -schedule lab appointment for fasting labs -follow up in 3-4 months  For the arm: -steroid cream and antifungal 1-2 times daily for 3 weeks -see dermatologist if persist  We recommend the following healthy lifestyle measures: - eat a healthy diet consisting of lots of vegetables, fruits, beans, nuts, seeds, healthy meats such as white chicken and fish and whole grains.  - avoid fried foods, fast food, processed foods, sodas, red meet and other fattening foods.  - get a least 150 minutes of aerobic exercise per week.       Kriste Basque R.

## 2015-02-01 ENCOUNTER — Other Ambulatory Visit (INDEPENDENT_AMBULATORY_CARE_PROVIDER_SITE_OTHER): Payer: BLUE CROSS/BLUE SHIELD

## 2015-02-01 DIAGNOSIS — R739 Hyperglycemia, unspecified: Secondary | ICD-10-CM

## 2015-02-01 DIAGNOSIS — Z114 Encounter for screening for human immunodeficiency virus [HIV]: Secondary | ICD-10-CM

## 2015-02-01 DIAGNOSIS — E785 Hyperlipidemia, unspecified: Secondary | ICD-10-CM

## 2015-02-01 DIAGNOSIS — E039 Hypothyroidism, unspecified: Secondary | ICD-10-CM

## 2015-02-01 LAB — LIPID PANEL
CHOLESTEROL: 183 mg/dL (ref 0–200)
HDL: 35.1 mg/dL — AB (ref >39.00)
NONHDL: 147.9
Total CHOL/HDL Ratio: 5
Triglycerides: 252 mg/dL — ABNORMAL HIGH (ref 0.0–149.0)
VLDL: 50.4 mg/dL — ABNORMAL HIGH (ref 0.0–40.0)

## 2015-02-01 LAB — HEMOGLOBIN A1C: Hgb A1c MFr Bld: 5.6 % (ref 4.6–6.5)

## 2015-02-01 LAB — TSH: TSH: 10.22 u[IU]/mL — AB (ref 0.35–4.50)

## 2015-02-01 LAB — LDL CHOLESTEROL, DIRECT: Direct LDL: 109 mg/dL

## 2015-02-02 ENCOUNTER — Other Ambulatory Visit: Payer: Self-pay | Admitting: *Deleted

## 2015-02-02 DIAGNOSIS — E038 Other specified hypothyroidism: Secondary | ICD-10-CM

## 2015-02-02 LAB — HIV ANTIBODY (ROUTINE TESTING W REFLEX): HIV 1&2 Ab, 4th Generation: NONREACTIVE

## 2015-02-02 MED ORDER — LEVOTHYROXINE SODIUM 50 MCG PO TABS: 50.0000 ug | ORAL_TABLET | Freq: Every day | ORAL | Status: DC

## 2015-03-16 ENCOUNTER — Other Ambulatory Visit (INDEPENDENT_AMBULATORY_CARE_PROVIDER_SITE_OTHER): Payer: BLUE CROSS/BLUE SHIELD

## 2015-03-16 DIAGNOSIS — E038 Other specified hypothyroidism: Secondary | ICD-10-CM | POA: Diagnosis not present

## 2015-03-16 LAB — TSH: TSH: 3.73 u[IU]/mL (ref 0.35–4.50)

## 2015-04-22 ENCOUNTER — Encounter: Payer: Self-pay | Admitting: Family Medicine

## 2015-04-22 ENCOUNTER — Ambulatory Visit (INDEPENDENT_AMBULATORY_CARE_PROVIDER_SITE_OTHER): Payer: BLUE CROSS/BLUE SHIELD | Admitting: Family Medicine

## 2015-04-22 VITALS — BP 110/82 | HR 88 | Temp 98.4°F | Ht 67.25 in | Wt 180.9 lb

## 2015-04-22 DIAGNOSIS — E782 Mixed hyperlipidemia: Secondary | ICD-10-CM | POA: Diagnosis not present

## 2015-04-22 DIAGNOSIS — M25562 Pain in left knee: Secondary | ICD-10-CM | POA: Diagnosis not present

## 2015-04-22 DIAGNOSIS — E038 Other specified hypothyroidism: Secondary | ICD-10-CM | POA: Diagnosis not present

## 2015-04-22 DIAGNOSIS — L309 Dermatitis, unspecified: Secondary | ICD-10-CM | POA: Diagnosis not present

## 2015-04-22 LAB — TSH: TSH: 6.78 u[IU]/mL — ABNORMAL HIGH (ref 0.35–4.50)

## 2015-04-22 NOTE — Progress Notes (Signed)
HPI:  He has a new problem of : knee pain -started 2 months, but resolved the last 1-2 weeks -mild soreness in L knee, when hyperflexes this knee -denies: weakness, pain with running, swelling, redness, giving away -occ clicking under knee cap with flexion  Eczema: -reports: much better -denies: malaise, fevers -hx atopic dermatitis in chart  HLD: -advised lifestyle recs -walking and trying to eat healthy  Hypothyroidism: -mild -restarted synthroid 01/2015 -feels fine -denies: constipations, lethargy, fatigues, hot/cold intol  ROS: See pertinent positives and negatives per HPI.  Past Medical History  Diagnosis Date  . Chicken pox as a child  . Left shoulder strain 07/23/2012  . Eczema 01/28/2015  . Other specified hypothyroidism 01/28/2015  . Mixed hyperlipidemia 06/15/2010    Qualifier: Diagnosis of  By: Abner Greenspan MD, Misty Stanley      Past Surgical History  Procedure Laterality Date  . Appendectomy  1995    Family History  Problem Relation Age of Onset  . Stroke Mother   . Hypertension Mother   . Pneumonia Father   . Heart disease Sister   . Heart attack Sister 25  . Stroke Sister 53  . Hypertension Sister   . Other Brother     eye surgery  . Other Brother     meningitis  . Cerebral palsy Brother     likely from birth  . Other Sister     s/p corneal transplant for cataract  . Diabetes Sister   . Cholelithiasis Sister     s/p cholecystectomy    History   Social History  . Marital Status: Married    Spouse Name: N/A  . Number of Children: N/A  . Years of Education: N/A   Social History Main Topics  . Smoking status: Never Smoker   . Smokeless tobacco: Never Used  . Alcohol Use: No  . Drug Use: No  . Sexual Activity: Not on file   Other Topics Concern  . 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           Current outpatient prescriptions:  .  levothyroxine (SYNTHROID, LEVOTHROID) 50 MCG tablet, Take 1 tablet (50 mcg total) by mouth daily., Disp: 30 tablet, Rfl: 3  EXAM:  Filed Vitals:   04/22/15 0849  BP: 110/82  Pulse: 88  Temp: 98.4 F (36.9 C)    Body mass index is 28.13 kg/(m^2).  GENERAL: vitals reviewed and listed above, alert, oriented, appears well hydrated and in no acute distress  HEENT: atraumatic, conjunttiva clear, no obvious abnormalities on inspection of external nose and ears  NECK: no obvious masses on inspection  LUNGS: clear to auscultation bilaterally, no wheezes, rales or rhonchi, good air movement  CV: HRRR, no peripheral edema  MS: moves all extremities without noticeable abnormality Gait normal, normal appearance of bother LEs, no effusion No TTP on exam today Mild patellar crepitus L knee, lachman neg, ant/post drawer neg, normal strength and ROM knee and LE, mildly + single leg squat, neg mcmurray  PSYCH: pleasant and cooperative, no obvious depression or anxiety  ASSESSMENT AND PLAN:  Discussed the following assessment and plan:  Other specified hypothyroidism - Plan:  TSH -check tsh, adjust synthroid as needed  Mixed hyperlipidemia -cont diet and exercise  Lateral knee pain, left -suspect PFS, minimal findings today and symptoms resolved -HEP and he is advised to call in 1 month to let us know how this is going  Eczema - improved  -Patient advised to return or notify a doctor immediately if symptoms worsen or persist or new concerns arise.  Patient Instructions  BEFORE YOU LEAVE: -knee exercises -labs -schedule follow up in 4 months  We recommend the following healthy lifestyle measures: - eat a healthy diet consisting of lots of vegetables, fruits, beans, nuts, seeds, healthy meats such as white chicken and fish  - avoid starches,  sweets, fried foods, fast food, processed foods, sodas, red meet and other fattening foods.  - get a least 150 minutes of aerobic exercise per week.    Do the exercises for strengthening the knee and call in one month to let us know how this is going      Zriyah Kopplin, Lucent TechnologiesHANNAH R.

## 2015-04-22 NOTE — Patient Instructions (Addendum)
BEFORE YOU LEAVE: -knee exercises -labs -schedule follow up in 4 months  We recommend the following healthy lifestyle measures: - eat a healthy diet consisting of lots of vegetables, fruits, beans, nuts, seeds, healthy meats such as white chicken and fish  - avoid starches, sweets, fried foods, fast food, processed foods, sodas, red meet and other fattening foods.  - get a least 150 minutes of aerobic exercise per week.    Do the exercises for strengthening the knee and call in one month to let us know how this is going

## 2015-04-22 NOTE — Progress Notes (Signed)
Pre visit review using our clinic review tool, if applicable. No additional management support is needed unless otherwise documented below in the visit note. 

## 2015-04-23 MED ORDER — LEVOTHYROXINE SODIUM 75 MCG PO TABS: 75.0000 ug | ORAL_TABLET | Freq: Every day | ORAL | Status: DC

## 2015-04-23 NOTE — Addendum Note (Signed)
Addended by: Johnella MoloneyFUNDERBURK, JO A on: 04/23/2015 04:35 PM   Modules accepted: Orders, Medications

## 2015-06-04 ENCOUNTER — Other Ambulatory Visit (INDEPENDENT_AMBULATORY_CARE_PROVIDER_SITE_OTHER): Payer: BLUE CROSS/BLUE SHIELD

## 2015-06-04 DIAGNOSIS — E038 Other specified hypothyroidism: Secondary | ICD-10-CM | POA: Diagnosis not present

## 2015-06-04 LAB — TSH: TSH: 1.58 u[IU]/mL (ref 0.35–4.50)

## 2015-07-28 ENCOUNTER — Other Ambulatory Visit: Payer: Self-pay | Admitting: *Deleted

## 2015-07-28 MED ORDER — LEVOTHYROXINE SODIUM 75 MCG PO TABS: 75.0000 ug | ORAL_TABLET | Freq: Every day | ORAL | Status: DC

## 2015-08-25 ENCOUNTER — Encounter: Payer: Self-pay | Admitting: Family Medicine

## 2015-08-25 ENCOUNTER — Ambulatory Visit (INDEPENDENT_AMBULATORY_CARE_PROVIDER_SITE_OTHER): Payer: BLUE CROSS/BLUE SHIELD | Admitting: Family Medicine

## 2015-08-25 VITALS — BP 110/78 | HR 76 | Temp 98.5°F | Ht 67.25 in | Wt 181.9 lb

## 2015-08-25 DIAGNOSIS — E782 Mixed hyperlipidemia: Secondary | ICD-10-CM | POA: Diagnosis not present

## 2015-08-25 DIAGNOSIS — E038 Other specified hypothyroidism: Secondary | ICD-10-CM

## 2015-08-25 DIAGNOSIS — M25569 Pain in unspecified knee: Secondary | ICD-10-CM

## 2015-08-25 DIAGNOSIS — Z23 Encounter for immunization: Secondary | ICD-10-CM

## 2015-08-25 LAB — LIPID PANEL
CHOL/HDL RATIO: 5
Cholesterol: 187 mg/dL (ref 0–200)
HDL: 36.1 mg/dL — ABNORMAL LOW (ref >39.00)
LDL Cholesterol: 116 mg/dL — ABNORMAL HIGH (ref 0–99)
NONHDL: 150.4
TRIGLYCERIDES: 172 mg/dL — AB (ref 0.0–149.0)
VLDL: 34.4 mg/dL (ref 0.0–40.0)

## 2015-08-25 LAB — TSH: TSH: 0.07 u[IU]/mL — AB (ref 0.35–4.50)

## 2015-08-25 MED ORDER — LEVOTHYROXINE SODIUM 75 MCG PO TABS: 75.0000 ug | ORAL_TABLET | Freq: Every day | ORAL | Status: DC

## 2015-08-25 NOTE — Progress Notes (Signed)
Pre visit review using our clinic review tool, if applicable. No additional management support is needed unless otherwise documented below in the visit note. 

## 2015-08-25 NOTE — Progress Notes (Signed)
HPI:   knee pain: -eval 04/2015 and advised to follow up in 1 month -reports pain completely resolved with HEP  HLD: -advised lifestyle recs -walking sometimes and trying to eat healthy - but not much regular exercise  Hypothyroidism: -mild -restarted synthroid 01/2015 -feels fine -asks" do I need to keep taking this medicine" -wants to recheck -denies: constipations, lethargy, fatigues, hot/cold intol  ROS: See pertinent positives and negatives per HPI.  Past Medical History  Diagnosis Date  . Chicken pox as a child  . Left shoulder strain 07/23/2012  . Eczema 01/28/2015  . Other specified hypothyroidism 01/28/2015  . Mixed hyperlipidemia 06/15/2010    Qualifier: Diagnosis of  By: Abner Greenspan MD, Misty Stanley      Past Surgical History  Procedure Laterality Date  . Appendectomy  1995    Family History  Problem Relation Age of Onset  . Stroke Mother   . Hypertension Mother   . Pneumonia Father   . Heart disease Sister   . Heart attack Sister 23  . Stroke Sister 3  . Hypertension Sister   . Other Brother     eye surgery  . Other Brother     meningitis  . Cerebral palsy Brother     likely from birth  . Other Sister     s/p corneal transplant for cataract  . Diabetes Sister   . Cholelithiasis Sister     s/p cholecystectomy    Social History   Social History  . Marital Status: Married    Spouse Name: N/A  . Number of Children: N/A  . Years of Education: N/A   Social History Main Topics  . Smoking status: Never Smoker   . Smokeless tobacco: Never Used  . Alcohol Use: No  . Drug Use: No  . Sexual Activity: Not Asked   Other Topics Concern  . 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           Current outpatient  prescriptions:  .  levothyroxine (SYNTHROID, LEVOTHROID) 75 MCG tablet, Take 1 tablet (75 mcg total) by mouth daily., Disp: 90 tablet, Rfl: 3  EXAM:  Filed Vitals:   08/25/15 0801  BP: 110/78  Pulse: 76  Temp: 98.5 F (36.9 C)    Body mass index is 28.28 kg/(m^2).  GENERAL: vitals reviewed and listed above, alert, oriented, appears well hydrated and in no acute distress  HEENT: atraumatic, conjunttiva clear, no obvious abnormalities on inspection of external nose and ears  NECK: no obvious masses on inspection  LUNGS: clear to auscultation bilaterally, no wheezes, rales or rhonchi, good air movement  CV: HRRR, no peripheral edema  MS: moves all extremities without noticeable abnormality Gait normal  PSYCH: pleasant and cooperative, no obvious depression or anxiety  ASSESSMENT AND PLAN:  Discussed the following assessment and plan:  Other specified hypothyroidism - Plan: TSH -check tsh, adjust synthroid as needed  Mixed hyperlipidemia -cont diet and exercise -check labs  Lateral knee pain, left -suspect PFS -resolved with HEP  -Patient advised to return or notify a doctor immediately if symptoms worsen or persist or new concerns arise.  Patient Instructions  BEFORE YOU LEAVE: -flu shot -  labs -follow up in 6 months  We recommend the following healthy lifestyle measures: - eat a healthy whole foods diet consisting of regular small meals composed of vegetables, fruits, beans, nuts, seeds, healthy meats such as white chicken and fish and whole grains.  - avoid sweets, white starchy foods, fried foods, fast food, processed foods, sodas, red meet and other fattening foods.  - get a least 150-300 minutes of aerobic exercise per week.    -We have ordered labs or studies at this visit. It can take up to 1-2 weeks for results and processing. We will contact you with instructions IF your results are abnormal. Normal results will be released to your [facility]. If you have  not heard from [place] or can not find your results in [facility] in 2 weeks please contact our office.            Kriste Basque R.

## 2015-08-25 NOTE — Patient Instructions (Addendum)
BEFORE YOU LEAVE: -flu shot -labs -follow up in 6 months  We recommend the following healthy lifestyle measures: - eat a healthy whole foods diet consisting of regular small meals composed of vegetables, fruits, beans, nuts, seeds, healthy meats such as white chicken and fish and whole grains.  - avoid sweets, white starchy foods, fried foods, fast food, processed foods, sodas, red meet and other fattening foods.  - get a least 150-300 minutes of aerobic exercise per week.   -We have ordered labs or studies at this visit. It can take up to 1-2 weeks for results and processing. We will contact you with instructions IF your results are abnormal. Normal results will be released to your MYCHART. If you have not heard from us or can not find your results in MYCHART in 2 weeks please contact our office.        

## 2015-08-26 MED ORDER — LEVOTHYROXINE SODIUM 50 MCG PO TABS: 50.0000 ug | ORAL_TABLET | Freq: Every day | ORAL | Status: DC

## 2015-08-26 NOTE — Addendum Note (Signed)
Addended by: Johnella Moloney on: 08/26/2015 05:07 PM   Modules accepted: Orders, Medications

## 2015-08-26 NOTE — Addendum Note (Signed)
Addended by: Johnella Moloney on: 08/26/2015 05:15 PM   Modules accepted: Orders

## 2015-10-01 ENCOUNTER — Ambulatory Visit: Payer: BLUE CROSS/BLUE SHIELD | Admitting: Family Medicine

## 2015-10-01 ENCOUNTER — Other Ambulatory Visit (INDEPENDENT_AMBULATORY_CARE_PROVIDER_SITE_OTHER): Payer: BLUE CROSS/BLUE SHIELD

## 2015-10-01 DIAGNOSIS — E038 Other specified hypothyroidism: Secondary | ICD-10-CM | POA: Diagnosis not present

## 2015-10-01 LAB — TSH: TSH: 0.46 u[IU]/mL (ref 0.35–4.50)

## 2015-12-01 ENCOUNTER — Encounter: Payer: Self-pay | Admitting: Family Medicine

## 2015-12-01 ENCOUNTER — Ambulatory Visit (INDEPENDENT_AMBULATORY_CARE_PROVIDER_SITE_OTHER): Payer: Managed Care, Other (non HMO) | Admitting: Family Medicine

## 2015-12-01 VITALS — BP 110/80 | HR 70 | Temp 98.3°F | Ht 67.25 in | Wt 183.1 lb

## 2015-12-01 DIAGNOSIS — E785 Hyperlipidemia, unspecified: Secondary | ICD-10-CM

## 2015-12-01 DIAGNOSIS — E663 Overweight: Secondary | ICD-10-CM

## 2015-12-01 DIAGNOSIS — E039 Hypothyroidism, unspecified: Secondary | ICD-10-CM | POA: Diagnosis not present

## 2015-12-01 LAB — LIPID PANEL
CHOL/HDL RATIO: 6
Cholesterol: 225 mg/dL — ABNORMAL HIGH (ref 0–200)
HDL: 39.1 mg/dL (ref >39.00)
LDL Cholesterol: 159 mg/dL — ABNORMAL HIGH (ref 0–99)
NONHDL: 186.13
Triglycerides: 135 mg/dL (ref 0.0–149.0)
VLDL: 27 mg/dL (ref 0.0–40.0)

## 2015-12-01 LAB — TSH: TSH: 8.8 u[IU]/mL — ABNORMAL HIGH (ref 0.35–4.50)

## 2015-12-01 MED ORDER — LEVOTHYROXINE SODIUM 50 MCG PO TABS: 50.0000 ug | ORAL_TABLET | Freq: Every day | ORAL | Status: DC

## 2015-12-01 NOTE — Progress Notes (Signed)
Pre visit review using our clinic review tool, if applicable. No additional management support is needed unless otherwise documented below in the visit note. 

## 2015-12-01 NOTE — Progress Notes (Signed)
HPI:  Follow up:  HLD/Overweight: -advised lifestyle recs -not exercising much, diet is so so  Hypothyroidism: -mild -restarted synthroid 01/2015 -feels fine -asks" do I need to keep taking this medicine" -wants to recheck -denies: constipations, lethargy, fatigues, hot/cold intol  He is moving to [place] in 1 month and will find a new PCP there.  ROS: See pertinent positives and negatives per HPI.  Past Medical History  Diagnosis Date  . Chicken pox as a child  . Left shoulder strain 07/23/2012  . Eczema 01/28/2015  . Other specified hypothyroidism 01/28/2015  . Mixed hyperlipidemia 06/15/2010    Qualifier: Diagnosis of  By: Abner GreenspanBlyth MD, Misty StanleyStacey      Past Surgical History  Procedure Laterality Date  . Appendectomy  1995    Family History  Problem Relation Age of Onset  . Stroke Mother   . Hypertension Mother   . Pneumonia Father   . Heart disease Sister   . Heart attack Sister [id number]  . Stroke Sister 1755  . Hypertension Sister   . Other Brother     eye surgery  . Other Brother     meningitis  . Cerebral palsy Brother     likely from birth  . Other Sister     s/p corneal transplant for cataract  . Diabetes Sister   . Cholelithiasis Sister     s/p cholecystectomy    Social History   Social History  . Marital Status: Married    Spouse Name: N/A  . Number of Children: N/A  . Years of Education: N/A   Social History Main Topics  . Smoking status: Never Smoker   . Smokeless tobacco: Never Used  . Alcohol Use: No  . Drug Use: No  . Sexual Activity: Not Asked   Other Topics Concern  . 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           Current outpatient prescriptions:  .  levothyroxine (SYNTHROID, LEVOTHROID) 50  MCG tablet, Take 1 tablet (50 mcg total) by mouth daily before breakfast., Disp: 90 tablet, Rfl: 1  EXAM:  Filed Vitals:   12/01/15 0802  BP: 110/80  Pulse: 70  Temp: 98.3 F (36.8 C)    Body mass index is 28.47 kg/(m^2).  GENERAL: vitals reviewed and listed above, alert, oriented, appears well hydrated and in no acute distress  HEENT: atraumatic, conjunttiva clear, no obvious abnormalities on inspection of external nose and ears  NECK: no obvious masses on inspection  LUNGS: clear to auscultation bilaterally, no wheezes, rales or rhonchi, good air movement  CV: HRRR, no peripheral edema  MS: moves all extremities without noticeable abnormality  PSYCH: pleasant and cooperative, no obvious depression or anxiety  ASSESSMENT AND PLAN:  Discussed the following assessment and plan:  Hypothyroidism, unspecified hypothyroidism type - Plan: TSH  Hyperlipemia - Plan: Lipid Panel  Overweight -check TSH and lipids -reguar exercise, healthy diet advised - discussed at length and educated on his increasing weight and overweight status and reverability -refills for 6 months to get to his new PCP apptoinment -Patient advised to return or notify a doctor immediately if symptoms worsen or persist or new  concerns arise.  Patient Instructions  BEFORE YOU LEAVE: -labs  -We have ordered labs or studies at this visit. It can take up to 1-2 weeks for results and processing. We will contact you with instructions IF your results are abnormal. Normal results will be released to your [facility]. If you have not heard from [place] or can not find your results in [facility] in 2 weeks please contact our office.  We recommend the following healthy lifestyle measures: - eat a healthy whole foods diet consisting of regular small meals composed of vegetables, fruits, beans, nuts, seeds, healthy meats such as white chicken and fish and whole grains.  - avoid sweets, white starchy foods, fried foods, fast food,  processed foods, sodas, red meet and other fattening foods.  - get a least 150-300 minutes of aerobic exercise per week.   Please establish care with a new doctor as soon as you move to your new location. Please feel free to contact our medical records office to obtain records to take to your new doctor. Please call us once you have established care with a new doctor to let us know. Thank you, and we will miss you!          Kriste Basque R.

## 2015-12-01 NOTE — Patient Instructions (Signed)
BEFORE YOU LEAVE: -labs  -We have ordered labs or studies at this visit. It can take up to 1-2 weeks for results and processing. We will contact you with instructions IF your results are abnormal. Normal results will be [facility]. If you have not heard from us or can not find your results in [facility] in 2 weeks please contact our office.  We recommend the following healthy lifestyle measures: - eat a healthy whole foods diet consisting of regular small meals composed of vegetables, fruits, beans, nuts, seeds, healthy meats such as white chicken and fish and whole grains.  - avoid sweets, white starchy foods, fried foods, fast food, processed foods, sodas, red meet and other fattening foods.  - get a least 150-300 minutes of aerobic exercise per week.   Please establish care with a new doctor as soon as you move to your new location. Please feel free to contact our medical records office to obtain records to take to your new doctor. Please call us once you have established care with a new doctor to let us know. Thank you, and we will miss you!

## 2015-12-06 MED ORDER — LEVOTHYROXINE SODIUM 75 MCG PO TABS: 75.0000 ug | ORAL_TABLET | Freq: Every day | ORAL | Status: DC

## 2015-12-06 NOTE — Addendum Note (Signed)
Addended by: Johnella MoloneyFUNDERBURK, Brianah Hopson A on: 12/06/2015 11:42 AM   Modules accepted: Orders, Medications

## 2015-12-26 ENCOUNTER — Other Ambulatory Visit: Payer: Self-pay | Admitting: Family Medicine

## 2016-02-23 ENCOUNTER — Ambulatory Visit (INDEPENDENT_AMBULATORY_CARE_PROVIDER_SITE_OTHER): Payer: Managed Care, Other (non HMO) | Admitting: Family Medicine

## 2016-02-23 ENCOUNTER — Encounter: Payer: Self-pay | Admitting: Family Medicine

## 2016-02-23 VITALS — BP 100/72 | HR 68 | Temp 98.2°F | Ht 67.25 in | Wt 180.7 lb

## 2016-02-23 DIAGNOSIS — M542 Cervicalgia: Secondary | ICD-10-CM

## 2016-02-23 DIAGNOSIS — E038 Other specified hypothyroidism: Secondary | ICD-10-CM | POA: Diagnosis not present

## 2016-02-23 DIAGNOSIS — E785 Hyperlipidemia, unspecified: Secondary | ICD-10-CM | POA: Diagnosis not present

## 2016-02-23 LAB — LDL CHOLESTEROL, DIRECT: Direct LDL: 114 mg/dL

## 2016-02-23 LAB — LIPID PANEL
CHOLESTEROL: 199 mg/dL (ref 0–200)
HDL: 35.1 mg/dL — AB (ref >39.00)
NonHDL: 164.32
TRIGLYCERIDES: 218 mg/dL — AB (ref 0.0–149.0)
Total CHOL/HDL Ratio: 6
VLDL: 43.6 mg/dL — AB (ref 0.0–40.0)

## 2016-02-23 LAB — TSH: TSH: 4.07 u[IU]/mL (ref 0.35–4.50)

## 2016-02-23 NOTE — Patient Instructions (Signed)
Before you leave: -Labs  Please establish care promptly with a new provider when you move. Otherwise follow up here in about 3-4 months.  Please seek care if your neck pain is recurrent or persistent.  Continue your thyroid medication daily on an empty stomach.  We recommend the following healthy lifestyle measures: - eat a healthy whole foods diet consisting of regular small meals composed of vegetables, fruits, beans, nuts, seeds, healthy meats such as white chicken and fish and whole grains.  - avoid sweets, white starchy foods, fried foods, fast food, processed foods, sodas, red meet and other fattening foods.  - get a least 150-300 minutes of aerobic exercise per week.    -We have ordered labs or studies at this visit. It can take up to 1-2 weeks for results and processing. We will contact you with instructions IF your results are abnormal. Normal results will be released to your [facility]. If you have not heard from us or can not find your results in [facility] in 2 weeks please contact our office.

## 2016-02-23 NOTE — Progress Notes (Signed)
HPI:  HLD/Overweight: -advised lifestyle recs -no hx statin intolerance -reports walking 3 days per week -reports healthy diet QRISK2 = 2  Hypothyroidism: -mild -restarted synthroid 01/2015, increased to last visit -feels fine -denies: constipations, lethargy, fatigues, hot/cold intol  Neck pain: -1-2 weeks, thinks due to pillow - was noticed at rest  -L SCM muscle, now resolved -no weakness, numbness, malaise, fevers, CP  He is moving to [place] in  3 weeks - reports stay here was extended due to work,  and he will find a new PCP there.   ROS: See pertinent positives and negatives per HPI.  Past Medical History  Diagnosis Date  . Chicken pox as a child  . Left shoulder strain 07/23/2012  . Eczema 01/28/2015  . Other specified hypothyroidism 01/28/2015  . Mixed hyperlipidemia 06/15/2010    Qualifier: Diagnosis of  By: Abner Greenspan MD, Misty Stanley      Past Surgical History  Procedure Laterality Date  . Appendectomy  1995    Family History  Problem Relation Age of Onset  . Stroke Mother   . Hypertension Mother   . Pneumonia Father   . Heart disease Sister   . Heart attack Sister 20  . Stroke Sister 71  . Hypertension Sister   . Other Brother     eye surgery  . Other Brother     meningitis  . Cerebral palsy Brother     likely from birth  . Other Sister     s/p corneal transplant for cataract  . Diabetes Sister   . Cholelithiasis Sister     s/p cholecystectomy    Social History   Social History  . Marital Status: Married    Spouse Name: N/A  . Number of Children: N/A  . Years of Education: N/A   Social History Main Topics  . Smoking status: Never Smoker   . Smokeless tobacco: Never Used  . Alcohol Use: No  . Drug Use: No  . Sexual Activity: Not Asked   Other Topics Concern  . 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           Current outpatient prescriptions:  .  levothyroxine (SYNTHROID, LEVOTHROID) 75 MCG tablet, Take 1 tablet (75 mcg total) by mouth daily., Disp: 90 tablet, Rfl: 0  EXAM:  Filed Vitals:   02/23/16 0813  BP: 100/72  Pulse: 68  Temp: 98.2 F (36.8 C)    Body mass index is 28.1 kg/(m^2).  GENERAL: vitals reviewed and listed above, alert, oriented, appears well hydrated and in no acute distress  HEENT: atraumatic, conjunttiva clear, no obvious abnormalities on inspection of external nose and ears  NECK: no obvious masses on inspection  LUNGS: clear to auscultation bilaterally, no wheezes, rales or rhonchi, good air movement  CV: HRRR, no peripheral edema  MS: moves all extremities without noticeable abnormality, normal inspection of the head and neck, no TTP, no swelling or deformity, normal ROB, neg spurling  PSYCH: pleasant and cooperative, no obvious depression or anxiety  ASSESSMENT AND PLAN:  Discussed the following assessment and plan:  Hyperlipemia - Plan: Lipid Panel  Other specified hypothyroidism - Plan: TSH  Neck pain  -lifestyle recs -FASTING labs -neck pain resolved and no findings on exam, advised re-eval if recurs -he plans to move in a few weeks and was advised to establish care with new PCP promptly -Patient advised to return or notify a doctor immediately if symptoms worsen or persist or new concerns arise.  Patient Instructions  Before you leave: -Labs  Please establish care promptly with a new provider when you move. Otherwise follow up here in about 3-4 months.  Please seek care if your neck pain is recurrent or persistent.  Continue your thyroid medication daily on an empty stomach.  We recommend the following healthy lifestyle measures: - eat a healthy whole foods diet consisting of regular small meals composed of  vegetables, fruits, beans, nuts, seeds, healthy meats such as white chicken and fish and whole grains.  - avoid sweets, white starchy foods, fried foods, fast food, processed foods, sodas, red meet and other fattening foods.  - get a least 150-300 minutes of aerobic exercise per week.    -We have ordered labs or studies at this visit. It can take up to 1-2 weeks for results and processing. We will contact you with instructions IF your results are abnormal. Normal results will be released to your [facility]. If you have not heard from us or can not find your results in [facility] in 2 weeks please contact our office.             Kriste BasqueKIM, Tashanda Fuhrer R.

## 2016-02-23 NOTE — Progress Notes (Signed)
Pre visit review using our clinic review tool, if applicable. No additional management support is needed unless otherwise documented below in the visit note. 

## 2016-03-06 ENCOUNTER — Other Ambulatory Visit: Payer: Self-pay | Admitting: Family Medicine

## 2017-08-09 ENCOUNTER — Encounter: Payer: Self-pay | Admitting: Family Medicine

## 2017-11-29 ENCOUNTER — Encounter: Payer: Self-pay | Admitting: Family Medicine
